# Patient Record
Sex: Male | Born: 2000 | Race: White | Hispanic: No | Marital: Single | Smoking: Never smoker
Health system: Southern US, Community
[De-identification: ages and names within clinical notes are randomized; demographics above are authoritative.]

## PROBLEM LIST (undated history)

## (undated) DIAGNOSIS — L509 Urticaria, unspecified: Secondary | ICD-10-CM

## (undated) DIAGNOSIS — F819 Developmental disorder of scholastic skills, unspecified: Secondary | ICD-10-CM

## (undated) DIAGNOSIS — J309 Allergic rhinitis, unspecified: Secondary | ICD-10-CM

## (undated) DIAGNOSIS — F909 Attention-deficit hyperactivity disorder, unspecified type: Secondary | ICD-10-CM

## (undated) HISTORY — DX: Urticaria, unspecified: L50.9

## (undated) HISTORY — DX: Allergic rhinitis, unspecified: J30.9

## (undated) HISTORY — PX: NO PAST SURGERIES: SHX2092

---
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
## 2023-01-23 ENCOUNTER — Encounter: Payer: Self-pay | Admitting: Pediatrics

## 2023-01-23 DIAGNOSIS — Z1379 Encounter for other screening for genetic and chromosomal anomalies: Secondary | ICD-10-CM | POA: Insufficient documentation

## 2023-02-16 ENCOUNTER — Encounter: Payer: Self-pay | Admitting: Pediatrics

## 2023-06-19 ENCOUNTER — Other Ambulatory Visit: Payer: Self-pay

## 2023-06-19 ENCOUNTER — Encounter: Payer: Self-pay | Admitting: Allergy & Immunology

## 2023-06-19 ENCOUNTER — Ambulatory Visit (INDEPENDENT_AMBULATORY_CARE_PROVIDER_SITE_OTHER): Payer: Commercial Managed Care - PPO | Admitting: Allergy & Immunology

## 2023-06-19 VITALS — BP 138/82 | HR 97 | Temp 98.6°F | Resp 16 | Ht 72.84 in | Wt 151.5 lb

## 2023-06-19 DIAGNOSIS — J310 Chronic rhinitis: Secondary | ICD-10-CM

## 2023-06-19 DIAGNOSIS — R1114 Bilious vomiting: Secondary | ICD-10-CM | POA: Diagnosis not present

## 2023-06-19 DIAGNOSIS — T7805XD Anaphylactic reaction due to tree nuts and seeds, subsequent encounter: Secondary | ICD-10-CM

## 2023-06-19 NOTE — Patient Instructions (Addendum)
 1. Chronic rhinitis  - Because of insurance stipulations, we cannot do skin testing on the same day as your first visit. - We are all working to fight this, but for now we need to do two separate visits.  - We will know more after we do testing at the next visit.  - The skin testing visit can be squeezed in at your convenience.  - Then we can make a more full plan to address all of his symptoms. - Be sure to stop your antihistamines for 3 days before this appointment.   2. Vomiting - We will see what the testing shows.   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 - We can update his EpiPen  then as well.  4. Return in about 1 week (around 06/26/2023). You can have the follow up appointment with Dr. Iva or a Nurse Practicioner (our Nurse Practitioners are excellent and always have Physician oversight!).    Please inform us  of any Emergency Department visits, hospitalizations, or changes in symptoms. Call us  before going to the ED for breathing or allergy  symptoms since we might be able to fit you in for a sick visit. Feel free to contact us  anytime with any questions, problems, or concerns.  It was a pleasure to meet you and your family today!  Websites that have reliable patient information: 1. American Academy of Asthma, Allergy , and Immunology: www.aaaai.org 2. Food Allergy  Research and Education (FARE): foodallergy.org 3. Mothers of Asthmatics: http://www.asthmacommunitynetwork.org 4. [employer] of Allergy , Asthma, and Immunology: www.acaai.org      "Like" us  on Facebook and Instagram for our latest updates!      A healthy democracy works best when [employer] participate! Make sure you are registered to vote! If you have moved or changed any of your contact information, you will need to get this updated before voting! Scan the QR codes below to learn more!          4

## 2023-06-19 NOTE — Progress Notes (Signed)
 NEW PATIENT  Date of Service/Encounter:  06/19/23  Consult requested by: Shona Norleen PEDLAR, MD   Assessment:   Tree nut allergy  (walnuts and hazelnuts)   Emesis - sounds like this is from postnasal drip  Environmental allergies - planning to test at the next visit  Developmentally delayed - seems to be plugged into a number of services, thankfully  Plan/Recommendations:   1. Chronic rhinitis  - Because of insurance stipulations, we cannot do skin testing on the same day as your first visit. - We are all working to fight this, but for now we need to do two separate visits.  - We will know more after we do testing at the next visit.  - The skin testing visit can be squeezed in at your convenience.  - Then we can make a more full plan to address all of his symptoms. - Be sure to stop your antihistamines for 3 days before this appointment.   2. Vomiting - We will see what the testing shows.   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 - We can update his EpiPen  then as well.  4. Return in about 1 week (around 06/26/2023). You can have the follow up appointment with Dr. Iva or a Nurse Practicioner (our Nurse Practitioners are excellent and always have Physician oversight!).   This note in its entirety was forwarded to the Provider who requested this consultation.  Subjective:   Jared Griffith is a 23 y.o. 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  Discussed the use of AI scribe software for clinical note transcription with the patient and/or guardian, who gave verbal consent to proceed.  Jared Griffith was referred by Shona Norleen PEDLAR, MD.     Jared Griffith is a 23 y.o. male presenting for an evaluation of allergies and food allergies .  Jared Griffith was last seen by Dr. Asa and Dr. Luke (over 3 years ago). He presents with recurrent episodes of vomiting, particularly in the shower. The frequency of these episodes is described as 'fairly frequent.' An endoscopy was performed with no significant findings.  The endoscopy was performed on October 29 by Dr. Selinda Molt in [place].  I cannot seem to see the actual results, but according to mom everything was normal.    Jared also has a history of sinus drainage, which was initially suspected to be the cause of the vomiting. However, despite the resolution of sinus drainage, the vomiting episodes have continued. The patient's mother reports that the vomitus is substantial in volume, although of course she is not in the shower with him. The patient's mother suspects that the vomiting episodes may be related to mold exposure from an old shower curtain, but this has not been  confirmed.  Jared Griffith has been on Protonix for approximately three to four months, which was initiated to manage suspected reflux. The medication has provided some relief, but the vomiting episodes persist. The patient also takes Benadryl  occasionally, which seems to provide some relief.  Jared Griffith has a history of a reaction to a large concentration of walnuts, but subsequent small exposures have not triggered a reaction.  It does not seem that he actually avoids any tree nuts to a significant level, but it is not a  significant part of his diet.  The patient also has a history of nasal congestion and has used nasal sprays intermittently. The patient's mother reports that the patient has difficulty blowing his nose.  The patient's mother is seeking further evaluation to rule out sinus problems as a potential cause of the vomiting episodes. The patient's mother also expressed interest in allergy  testing, particularly for environmental allergens and molds. She does NOT want to go to the [place] office because she is afraid she will know people in the waiting room.  She apparently works in patent examiner and does not want to see anyone she has arrested.    Allergic Rhinitis Symptom History: He is unable to blow his nose. He was seen by [facility] in [place], but they did not have a good experience with that practice.  They are worried about mold because of the possibility of mold and mildew.  He had some issues in his bathroom and they are trying to figure this out.  GERD Symptom History: He is on Protonix. HJe has been on that for 3-4 months. This has helped somewhat, but the sinus drainage is also better now. He did use some Benadryl  which does help.   History (copied and pasted from Dr. Marta note from September 2024: Following a healthy pregnancy, Jared Griffith was born at [redacted] weeks gestation (birth weight: 6 pounds, 10 ounces) via vaginal delivery. Jared Griffith's birth was complicated by prolonged time in the birth canal, and he had feeding difficulties due to weak oral motor coordination. Jared Griffith met the majority of major developmental milestones at expected times but crawled slightly later at 11 months and walked at 20 months. He also developed the use of short phrases later than expected at 30 months. Jared Griffith has no major medical concerns, but does have allergies to penicillin, sulfasalazine, and tree nuts. He is currently prescribed no medications. Jared Griffith has taken several medications in the past for ADHD, but these  became less effective and less necessary as he got older and demands at school began to shift. 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 As such, it is unlikely that this variant is pathogenic. His family history is notable for learning difficulties, ADHD, and schizophrenia.   Otherwise, there is no history of other atopic diseases, including drug allergies, stinging insect allergies, or contact dermatitis. There is no significant infectious history. Vaccinations are up to date.    Past Medical History: Patient Active Problem List   Diagnosis Date Noted   Genetic testing 01/23/2023   Seasonal and perennial allergic rhinoconjunctivitis 03/20/2019   Allergy  with anaphylaxis due to food 03/20/2019   Seasonal allergic conjunctivitis 09/25/2017   Seasonal allergic rhinitis due to pollen 09/25/2017   Low weight, pediatric, BMI less than 5th percentile for age 54/24/2016   ADHD (attention deficit hyperactivity disorder), combined type 01/26/2014   Dysarthria  01/26/2014   Developmental delay 07/27/2013   Perennial allergic rhinitis 05/27/2013    Medication List:  Allergies as of 06/19/2023       Reactions   Sulfa Antibiotics Hives   hives   Grass Pollen([id number] Vern) Other (See Comments)   Penicillins Hives        Medication List        Accurate as of June 19, 2023  4:39 PM. If you have any questions, ask your nurse or doctor.          STOP taking these medications    famotidine  20 MG tablet Commonly known as: PEPCID  Stopped by: Jared Griffith       TAKE these medications    benzonatate 100 MG capsule Commonly known as: TESSALON Take 100 mg by mouth 3 (three) times daily as needed for cough.   diphenhydrAMINE  25 MG tablet Commonly known as: BENADRYL  Take 1 tablet (25 mg total) by mouth  every 6 (six) hours as needed for up to 3 days.   EPINEPHrine  0.3 mg/0.3 mL Soaj injection Commonly known as: EPI-PEN INJECT INTO THE MUSCLE FOR ONE DOSE UTD   fluticasone  50 MCG/ACT nasal spray Commonly known as: Flonase  Use 2 sprays per nostril once a day for runny nose or itchy eyes   levocetirizine 5 MG tablet Commonly known as: XYZAL  Take 1 tablet (5 mg total) by mouth every evening.   loratadine 10 MG tablet Commonly known as: CLARITIN Take 10 mg by mouth at bedtime as needed for allergies.   minocycline 100 MG capsule Commonly known as: MINOCIN   ondansetron  4 MG disintegrating tablet Commonly known as: ZOFRAN -ODT Take 4 mg by mouth every 8 (eight) hours as needed for vomiting, nausea or refractory nausea / vomiting.   pantoprazole 40 MG tablet Commonly known as: PROTONIX Take 40 mg by mouth daily.        Birth History: non-contributory  Developmental History: non-contributory  Past Surgical History: Past Surgical History:  Procedure Laterality Date   NO PAST SURGERIES       Family History: Family History  Problem Relation Age of Onset   Asthma Mother    Allergic rhinitis Mother    Food Allergy  Mother    Eczema Neg Hx    Urticaria Neg Hx    Immunodeficiency Neg Hx    Angioedema Neg Hx      Social History: Jared Griffith lives at home with his parents.  They live in a house that is 65 years old.  There is hardwood in the main living areas and electric vinyl plank in the bedroom.  They have electric heating and central cooling.  There is a dog inside of the home.  There are no dust mite covers on the bedding.  There is no tobacco exposure.  He is currently in high school.  There is no fume, chemical, or dust exposure.  There is a HEPA filter in the home.  Do not live near an interstate or industrial area.   Review of systems otherwise negative other than that mentioned in the HPI.    Objective:   Blood pressure 138/82, pulse 97, temperature 98.6 F (37  C), temperature source Temporal, resp. rate 16, height 6' 0.84 (1.85 m), weight 151 lb 8 oz (68.7 kg), SpO2 96%. Body mass index is 20.08 kg/m.     Physical Exam Vitals reviewed.  Constitutional:      Appearance: He is well-developed and underweight. He is not ill-appearing or toxic-appearing.  Comments: Developmentally delayed. Talkative.   HENT:     Head: Normocephalic and atraumatic.     Right Ear: Tympanic membrane, ear canal and external ear normal. No drainage, swelling or tenderness. Tympanic membrane is not injected, scarred, erythematous, retracted or bulging.     Left Ear: Tympanic membrane, ear canal and external ear normal. No drainage, swelling or tenderness. Tympanic membrane is not injected, scarred, erythematous, retracted or bulging.     Nose: Mucosal edema and rhinorrhea present. No nasal deformity or septal deviation.     Right Turbinates: Enlarged and swollen.     Left Turbinates: Enlarged and swollen.     Right Sinus: No maxillary sinus tenderness or frontal sinus tenderness.     Left Sinus: No maxillary sinus tenderness or frontal sinus tenderness.     Mouth/Throat:     Mouth: Mucous membranes are not pale and not dry.     Pharynx: Uvula midline.  Eyes:     General:        Right eye: No discharge.        Left eye: No discharge.     Conjunctiva/sclera: Conjunctivae normal.     Right eye: Right conjunctiva is not injected. No chemosis.    Left eye: Left conjunctiva is not injected. No chemosis.    Pupils: Pupils are equal, round, and reactive to light.  Cardiovascular:     Rate and Rhythm: Normal rate and regular rhythm.     Heart sounds: Normal heart sounds.  Pulmonary:     Effort: Pulmonary effort is normal. No tachypnea, accessory muscle usage or respiratory distress.     Breath sounds: Normal breath sounds. No wheezing, rhonchi or rales.  Chest:     Chest wall: No tenderness.  Abdominal:     Tenderness: There is no abdominal tenderness. There is  no guarding or rebound.  Lymphadenopathy:     Head:     Right side of head: No submandibular, tonsillar or occipital adenopathy.     Left side of head: No submandibular, tonsillar or occipital adenopathy.     Cervical: No cervical adenopathy.  Skin:    Coloration: Skin is not pale.     Findings: No abrasion, erythema, petechiae or rash. Rash is not papular, urticarial or vesicular.  Neurological:     Mental Status: He is alert.  Psychiatric:        Behavior: Behavior is cooperative.      Diagnostic studies: deferred due to insurance stipulations that require a separate visit for testing         Jared Shaggy, MD [facility] 

## 2023-06-21 ENCOUNTER — Encounter: Payer: Self-pay | Admitting: Allergy & Immunology

## 2023-06-29 ENCOUNTER — Other Ambulatory Visit: Payer: Self-pay

## 2023-06-29 ENCOUNTER — Ambulatory Visit (INDEPENDENT_AMBULATORY_CARE_PROVIDER_SITE_OTHER): Payer: Commercial Managed Care - PPO | Admitting: Allergy & Immunology

## 2023-06-29 DIAGNOSIS — J3089 Other allergic rhinitis: Secondary | ICD-10-CM | POA: Diagnosis not present

## 2023-06-29 DIAGNOSIS — Z91018 Allergy to other foods: Secondary | ICD-10-CM

## 2023-06-29 DIAGNOSIS — J302 Other seasonal allergic rhinitis: Secondary | ICD-10-CM

## 2023-06-29 MED ORDER — AZELASTINE-FLUTICASONE 137-50 MCG/ACT NA SUSP: 2.0000 | Freq: Every day | NASAL | 5 refills | Status: DC | PRN

## 2023-06-29 MED ORDER — AZELASTINE-FLUTICASONE 137-50 MCG/ACT NA SUSP: 2.0000 | NASAL | 5 refills | Status: DC

## 2023-06-29 MED ORDER — EPINEPHRINE 0.3 MG/0.3ML IJ SOAJ: 0.3000 mg | Freq: Once | INTRAMUSCULAR | 2 refills | Status: AC

## 2023-06-29 MED ORDER — LEVOCETIRIZINE DIHYDROCHLORIDE 5 MG PO TABS: 5.0000 mg | ORAL_TABLET | Freq: Every evening | ORAL | 1 refills | Status: DC

## 2023-06-29 NOTE — Patient Instructions (Addendum)
1. Chronic rhinitis  - Testing today showed: grasses, ragweed, weeds, trees, cat, and cockroach - Copy of test results provided.  - Avoidance measures provided. - Start taking: Dymista two sprays per nostril twice daily (aim for the ears)  - Continue with: Xyzal (levocetirizine) 5mg  tablet once daily and Flonase (fluticasone) one spray per nostril daily (AIM FOR EAR ON EACH SIDE) - You can use an extra dose of the antihistamine, if needed, for breakthrough symptoms.  - Consider nasal saline rinses 1-2 times daily to remove allergens from the nasal cavities as well as help with mucous clearance (this is especially helpful to do before the nasal sprays are given) - We will start allergy shots as a means of long-term control. - Allergy shots "re-train" and "reset" the immune system to ignore environmental allergens and decrease the resulting immune response to those allergens (sneezing, itchy watery eyes, runny nose, nasal congestion, etc).    - Allergy shots improve symptoms in 75-85% of patients.   2. Vomiting - Hopefully controlling postnasal drip and mucus will help with decreasing vomiting.  3. Anaphylaxis due to tree nut - Testing was positive to hazelnut. - EpiPen renewed. - Emergency Action Plan updated.   4. Return in about 3 months (around 09/27/2023). You can have the follow up appointment with Dr. Dellis Anes or a Nurse Practicioner (our Nurse Practitioners are excellent and always have Physician oversight!).    Please inform us of any Emergency Department visits, hospitalizations, or changes in symptoms. Call us before going to the ED for breathing or allergy symptoms since we might be able to fit you in for a sick visit. Feel free to contact us anytime with any questions, problems, or concerns.  It was a pleasure to see you and your family again today!  Websites that have reliable patient information: 1. American Academy of Asthma, Allergy, and Immunology: www.aaaai.org 2. Food  Allergy Research and Education (FARE): foodallergy.org 3. Mothers of Asthmatics: http://www.asthmacommunitynetwork.org 4. American College of Allergy, Asthma, and Immunology: www.acaai.org      "Like" [place] on Facebook and Instagram for our latest updates!      A healthy democracy works best when [employer] participate! Make sure you are registered to vote! If you have moved or changed any of your contact information, you will need to get this updated before voting! Scan the QR codes below to learn more!         Airborne Adult Perc - 06/29/23 1419     Time Antigen Placed 1419    Allergen Manufacturer Waynette Buttery    Location Back    Number of Test 55    1. Control-Buffer 50% Glycerol Negative    2. Control-Histamine 2+    3. [place]ia 3+    4. [place] 2+    5. Johnson 3+    6. Kentucky Blue 3+    7. Meadow Fescue 3+    8. Perennial Rye 3+    9. Timothy 3+    10. Ragweed Mix Negative    11. Cocklebur 2+    12. Plantain,  English Negative    13. Baccharis Negative    14. Dog Fennel Negative    15. Russian Thistle Negative    16. Lamb's Quarters Negative    17. Sheep Sorrell Negative    18. Rough Pigweed Negative    19. Marsh Elder, Rough 2+    20. Mugwort, Common 2+    21. Box, Elder Negative    22. Cedar, red 2+  23. Sweet Gum 3+    24. Pecan Pollen 3+    25. Pine Mix Negative    26. Walnut, Black Pollen 2+    27. Red Mulberry 2+    28. Ash Mix 2+    29. Birch Mix 3+    30. Beech American 3+    31. Cottonwood, [place] 2+    32. Hickory, White 3+    33. Maple Mix 2+    34. Oak, [place] Mix 2+    35. Sycamore Eastern 3+    36. Alternaria Alternata Negative    37. Cladosporium Herbarum 2+    38. Aspergillus Mix Negative    39. Penicillium Mix Negative    40. Bipolaris Sorokiniana (Helminthosporium) Negative    41. Drechslera Spicifera (Curvularia) Negative    42. Mucor Plumbeus Negative    43. Fusarium Moniliforme Negative    44. Aureobasidium Pullulans (pullulara)  Negative    45. Rhizopus Oryzae Negative    46. Botrytis Cinera Negative    47. Epicoccum Nigrum Negative    48. Phoma Betae Negative    49. Dust Mite Mix Negative    50. Cat Hair 10,000 BAU/ml 2+    51.  Dog Epithelia Negative    52. Mixed Feathers Negative    53. Horse Epithelia Negative    54. Cockroach, German 3+    55. Tobacco Leaf Negative             Food Adult Perc - 06/29/23 1400     Time Antigen Placed 1419    Allergen Manufacturer Waynette Buttery    Location Back    Number of allergen test 8    10. Cashew Negative    11. Walnut Food Negative    12. Almond Negative    13. Hazelnut --   5 x 10   14. Pecan Food Negative    15. Pistachio Negative    16. [place] Nut Negative    17. Coconut Negative            Reducing Pollen Exposure  The American Academy of Allergy, Asthma and Immunology suggests the following steps to reduce your exposure to pollen during allergy seasons.    Do not hang sheets or clothing out to dry; pollen may collect on these items. Do not mow lawns or spend time around freshly cut grass; mowing stirs up pollen. Keep windows closed at night.  Keep car windows closed while driving. Minimize morning activities outdoors, a time when pollen counts are usually at their highest. Stay indoors as much as possible when pollen counts or humidity is high and on windy days when pollen tends to remain in the air longer. Use air conditioning when possible.  Many air conditioners have filters that trap the pollen spores. Use a HEPA room air filter to remove pollen form the indoor air you breathe.  Control of Dog or Cat Allergen  Avoidance is the best way to manage a dog or cat allergy. If you have a dog or cat and are allergic to dog or cats, consider removing the dog or cat from the home. If you have a dog or cat but don't want to find it a new home, or if your family wants a pet even though someone in the household is allergic, here are some strategies that may  help keep symptoms at bay:  Keep the pet out of your bedroom and restrict it to only a few rooms. Be advised that keeping the dog or cat  in only one room will not limit the allergens to that room. Don't pet, hug or kiss the dog or cat; if you do, wash your hands with soap and water. High-efficiency particulate air (HEPA) cleaners run continuously in a bedroom or living room can reduce allergen levels over time. Regular use of a high-efficiency vacuum cleaner or a central vacuum can reduce allergen levels. Giving your dog or cat a bath at least once a week can reduce airborne allergen.  Control of Cockroach Allergen  Cockroach allergen has been identified as an important cause of acute attacks of asthma, especially in urban settings.  There are fifty-five species of cockroach that exist in the [place], however only three, the [place], [place] species produce allergen that can affect patients with Asthma.  Allergens can be obtained from fecal particles, egg casings and secretions from cockroaches.    Remove food sources. Reduce access to water. Seal access and entry points. Spray runways with 0.5-1% Diazinon or Chlorpyrifos Blow boric acid power under stoves and refrigerator. Place bait stations (hydramethylnon) at feeding sites.  Control of Mold Allergen   Mold and fungi can grow on a variety of surfaces provided certain temperature and moisture conditions exist.  Outdoor molds grow on plants, decaying vegetation and soil.  The major outdoor mold, Alternaria and Cladosporium, are found in very high numbers during hot and dry conditions.  Generally, a late Summer - Fall peak is seen for common outdoor fungal spores.  Rain will temporarily lower outdoor mold spore count, but counts rise rapidly when the rainy period ends.  The most important indoor molds are Aspergillus and Penicillium.  Dark, humid and poorly ventilated basements are ideal sites for mold growth.  The next most  common sites of mold growth are the bathroom and the kitchen.  Outdoor (Seasonal) Mold Control  Positive outdoor molds via skin testing: Alternaria  Use air conditioning and keep windows closed Avoid exposure to decaying vegetation. Avoid leaf raking. Avoid grain handling. Consider wearing a face mask if working in moldy areas.

## 2023-06-29 NOTE — Progress Notes (Unsigned)
FOLLOW UP  Date of Service/Encounter:  06/29/23   Assessment:   Tree nut allergy (walnuts and hazelnuts)    Emesis - sounds like this is from postnasal drip   Perennial and seasonal allergic rhinitis (grasses, ragweed, weeds, trees, cat, and cockroach) - we are going to work on controlling the postnasal drip with medications before advancing to allergy shots   Developmentally delayed - established into a number of services  Plan/Recommendations:   1. Chronic rhinitis  - Testing today showed: grasses, ragweed, weeds, trees, cat, and cockroach - Copy of test results provided.  - Avoidance measures provided. - Start taking: Dymista two sprays per nostril twice daily (aim for the ears)  - Continue with: Xyzal (levocetirizine) 5mg  tablet once daily and Flonase (fluticasone) one spray per nostril daily (AIM FOR EAR ON EACH SIDE) - You can use an extra dose of the antihistamine, if needed, for breakthrough symptoms.  - Consider nasal saline rinses 1-2 times daily to remove allergens from the nasal cavities as well as help with mucous clearance (this is especially helpful to do before the nasal sprays are given) - We will consider start allergy shots as a means of long-term control. - Allergy shots "re-train" and "reset" the immune system to ignore environmental allergens and decrease the resulting immune response to those allergens (sneezing, itchy watery eyes, runny nose, nasal congestion, etc).    - Allergy shots improve symptoms in 75-85% of patients.   2. Vomiting - Hopefully controlling postnasal drip and mucus will help with decreasing vomiting.  3. Anaphylaxis due to tree nut - Testing was positive to hazelnut. - EpiPen renewed. - Emergency Action Plan updated.   4. Return in about 3 months (around 09/27/2023). You can have the follow up appointment with Dr. Dellis Anes or a Nurse Practicioner (our Nurse Practitioners are excellent and always have Physician oversight!).     Subjective:   Jared Griffith is a 23 y.o. male presenting today for follow up of No chief complaint on file.   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  Discussed the use of AI scribe software for clinical note transcription with the patient and/or guardian, who gave verbal consent to proceed.  Jared Griffith is a 23 y.o. male presenting for skin testing. He was last seen on January 14th. We could not do testing because his insurance company does not cover testing on the same day as a New Patient visit. He has been off of all antihistamines 3 days in anticipation of the testing.   Otherwise, there have been no changes to his past medical history, surgical history, family history, or social history.    Review of systems otherwise negative other than that mentioned in the HPI.    Objective:   There were no vitals taken for this visit. There is no height or weight on file to calculate BMI.    Physical exam deferred since this was a  skin testing appointment only.   Diagnostic studies:    Allergy Studies:     Airborne Adult Perc - 06/29/23 1419     Time Antigen Placed 1419    Allergen Manufacturer Waynette Buttery    Location Back    Number of Test 55    1. Control-Buffer 50% Glycerol Negative    2. Control-Histamine 2+    3. Bahia 3+    4. [place] 2+    5. Johnson 3+    6.  [place] Blue 3+    7. Meadow Fescue 3+    8. Perennial Rye 3+    9. Timothy 3+    10. Ragweed Mix Negative    11. Cocklebur 2+    12. Plantain,  English Negative    13. Baccharis Negative    14. Dog Fennel Negative    15. Russian Thistle Negative    16. Lamb's Quarters Negative    17. Sheep Sorrell Negative    18. Rough Pigweed Negative    19. Marsh Elder, Rough 2+    20. Mugwort, Common 2+    21. Box, Elder Negative    22. Cedar, red 2+    23. Sweet Gum 3+    24. Pecan Pollen 3+    25. Pine Mix Negative    26. Walnut, Black Pollen 2+    27. Red Mulberry 2+    28. Ash Mix 2+    29. Birch Mix 3+    30. Beech American 3+    31. Cottonwood, [place] 2+    32. Hickory, White 3+    33. Maple Mix 2+    34. Oak, [place] Mix 2+    35. Sycamore Eastern 3+    36. Alternaria Alternata Negative    37. Cladosporium Herbarum 2+    38. Aspergillus Mix Negative    39. Penicillium Mix Negative    40. Bipolaris Sorokiniana (Helminthosporium) Negative    41. Drechslera Spicifera (Curvularia) Negative    42. Mucor Plumbeus Negative    43. Fusarium Moniliforme Negative    44. Aureobasidium Pullulans (pullulara) Negative    45. Rhizopus Oryzae Negative    46. Botrytis Cinera Negative    47. Epicoccum Nigrum Negative    48. Phoma Betae Negative    49. Dust Mite Mix Negative    50. Cat Hair 10,000 BAU/ml 2+    51.  Dog Epithelia Negative    52. Mixed Feathers Negative    53. Horse Epithelia Negative    54. Cockroach, German 3+    55. Tobacco Leaf Negative             Food Adult Perc - 06/29/23 1400     Time Antigen Placed 1419    Allergen Manufacturer Waynette Buttery    Location Back    Number of allergen test 8    10. Cashew Negative    11. Walnut Food Negative    12. Almond Negative    13. Hazelnut --   5 x 10   14. Pecan Food Negative    15. Pistachio Negative    16. [place] Nut Negative    17. Coconut Negative             Allergy testing results were read and interpreted  by myself, documented by clinical staff.      Malachi Bonds, MD  [facility]

## 2023-07-01 ENCOUNTER — Encounter: Payer: Self-pay | Admitting: Allergy & Immunology

## 2023-07-02 ENCOUNTER — Telehealth: Payer: Self-pay

## 2023-07-02 ENCOUNTER — Other Ambulatory Visit (HOSPITAL_COMMUNITY): Payer: Self-pay

## 2023-07-02 NOTE — Telephone Encounter (Signed)
Called patient - spoke to mother, Herbert Seta  - DOB/DPR verified - advised of PA notation below.  Mom asked about PA on Fluticasone (Flonase) nasal spray -  advised no updates yet.  Mom verbalized understanding, no further questions.

## 2023-07-02 NOTE — Telephone Encounter (Signed)
Pharmacy Patient Advocate Encounter   Received notification from Patient Pharmacy that prior authorization for EPINEPHrine 0.3mg /0.32mL Soaj injection is required/requested.   Insurance verification completed.   The patient is insured through [employer] .   Per test claim: The current 30 day co-pay is, $10.00.  No PA needed at this time. This test claim was processed through [facility]- copay amounts may vary at other pharmacies due to pharmacy/plan contracts, or as the patient moves through the different stages of their insurance plan.

## 2023-07-03 ENCOUNTER — Other Ambulatory Visit (HOSPITAL_COMMUNITY): Payer: Self-pay

## 2023-07-03 ENCOUNTER — Telehealth: Payer: Self-pay

## 2023-07-03 NOTE — Telephone Encounter (Signed)
*  Asthma/Allergy  Pharmacy Patient Advocate Encounter  Received notification from [facility] that Prior Authorization for Azelastine-Fluticasone 137-50MCG/ACT suspension  has been APPROVED from 07/03/2023 to 07/01/2024. Ran test claim, Copay is $10.00. This test claim was processed through [facility]- copay amounts may vary at other pharmacies due to pharmacy/plan contracts, or as the patient moves through the different stages of their insurance plan.   PA #/Case ID/Reference #: [id number]

## 2023-07-24 ENCOUNTER — Other Ambulatory Visit: Payer: Self-pay | Admitting: Allergy & Immunology

## 2023-07-24 DIAGNOSIS — J3081 Allergic rhinitis due to animal (cat) (dog) hair and dander: Secondary | ICD-10-CM | POA: Diagnosis not present

## 2023-07-24 DIAGNOSIS — J3089 Other allergic rhinitis: Secondary | ICD-10-CM

## 2023-07-24 NOTE — Progress Notes (Signed)
 VIAL SET ONE MADE 07-24-23.  EXP 08-02-24.

## 2023-07-24 NOTE — Progress Notes (Signed)
 Aeroallergen Immunotherapy   Ordering Provider: Dr. Malachi Bonds   Patient Details  Name: Jared Griffith  MRN: [id number]  Date of Birth: Aug 22, 2000   Order 2 of 2   Vial Label: RW/DM/Molds/CR   0.3 ml (Volume)  1:20 Concentration -- Ragweed Mix  0.2 ml (Volume)  1:20 Concentration -- Alternaria alternata  0.2 ml (Volume)  1:20 Concentration -- Cladosporium herbarum  0.2 ml (Volume)  1:10 Concentration -- Mucor plumbeus  0.2 ml (Volume)  1:10 Concentration -- Fusarium moniliforme  0.3 ml (Volume)  1:20 Concentration -- Cockroach, German  0.7 ml (Volume)   AU Concentration -- Mite Mix (DF 5,000 & DP 5,000)    2.1  ml Extract Subtotal  2.9  ml Diluent  5.0  ml Maintenance Total   Schedule:  B   Blue Vial (1:100,000): Schedule B (6 doses)  Yellow Vial (1:10,000): Schedule B (6 doses)  Green Vial (1:1,000): Schedule B (6 doses)  Red Vial (1:100): Schedule A (14 doses)   Special Instructions: After completion of the first Red Vial, please space to every two weeks. After completion of the second Red Vial, please space to every 4 weeks. Ok to up dose new vials at 0.76mL --> 0.3 mL --> 0.5 mL. Ok to come twice weekly, if desired, as long as there is 48 hours between injections.

## 2023-07-24 NOTE — Progress Notes (Signed)
 Aeroallergen Immunotherapy   Ordering Provider: Dr. Malachi Bonds   Patient Details  Name: Jared Griffith  MRN: [id number]  Date of Birth: [birth year]   Order 1 of 2   Vial Label: G/W/T/C/D   0.3 ml (Volume)  BAU Concentration -- 7 Grass Mix* 100,000 ([address] [place], [place], [place], [place] Rye, RedTop, Sweet Vernal, Timothy)  0.2 ml (Volume)  1:20 Concentration -- Bahia  0.3 ml (Volume)  BAU Concentration -- [place] 10,000  0.2 ml (Volume)  1:20 Concentration -- Johnson  0.2 ml (Volume)  1:20 Concentration -- Cocklebur  0.2 ml (Volume)  1:20 Concentration -- Marsh elder, Rough*  0.2 ml (Volume)  1:20 Concentration -- Mugwort, Common*  0.5 ml (Volume)  1:20 Concentration -- Eastern 10 Tree Mix (also Sweet Gum)  0.2 ml (Volume)  1:10 Concentration -- Cedar, red  0.2 ml (Volume)  1:10 Concentration -- Pecan Pollen  0.2 ml (Volume)  1:10 Concentration -- Pine Mix  0.2 ml (Volume)  1:20 Concentration -- Red Mulberry  0.2 ml (Volume)  1:20 Concentration -- Sweet Gum  0.2 ml (Volume)  1:20 Concentration -- Walnut, Black Pollen  0.5 ml (Volume)  1:10 Concentration -- Cat Hair  0.5 ml (Volume)  1:10 Concentration -- Dog Epithelia    4.3  ml Extract Subtotal  0.7  ml Diluent  5.0  ml Maintenance Total   Schedule:  B   Blue Vial (1:100,000): Schedule B (6 doses)  Yellow Vial (1:10,000): Schedule B (6 doses)  Green Vial (1:1,000): Schedule B (6 doses)  Red Vial (1:100): Schedule A (14 doses)   Special Instructions: After completion of the first Red Vial, please space to every two weeks. After completion of the second Red Vial, please space to every 4 weeks. Ok to up dose new vials at 0.21mL --> 0.3 mL --> 0.5 mL. Ok to come twice weekly, if desired, as long as there is 48 hours between injections.

## 2023-07-24 NOTE — Progress Notes (Unsigned)
 Orders written for immunotherapy. We are combining results from Dr. Beaulah Dinning and his newest testing.

## 2023-07-25 DIAGNOSIS — J3089 Other allergic rhinitis: Secondary | ICD-10-CM | POA: Diagnosis not present

## 2023-07-25 NOTE — Progress Notes (Signed)
 VIAL SET TWO MADE 07-25-23.  EXP 07-24-24.

## 2023-07-27 ENCOUNTER — Ambulatory Visit: Payer: Commercial Managed Care - PPO

## 2023-08-06 ENCOUNTER — Ambulatory Visit (INDEPENDENT_AMBULATORY_CARE_PROVIDER_SITE_OTHER): Payer: Commercial Managed Care - PPO

## 2023-08-06 DIAGNOSIS — J309 Allergic rhinitis, unspecified: Secondary | ICD-10-CM

## 2023-08-06 NOTE — Progress Notes (Signed)
 Immunotherapy   Patient Details  Name: Jared Griffith MRN: [id number] Date of Birth: September 17, 2000  08/06/2023  Jared Griffith started injections for  rag weeds, dust mites, colds, cock roaches, grasses, weeds, trees, dogs, and cats. Following schedule: B  Frequency:2 times per week Epi-Pen:Epi-Pen Available  Consent signed in office previously and patient instructions given. Patient was reminded to bring Epipen to every injection appointment. Patient and his mom sat in the lobby for thirty minutes without an issue.    Ralene Muskrat 08/06/2023, 2:08 PM

## 2023-08-10 ENCOUNTER — Ambulatory Visit (INDEPENDENT_AMBULATORY_CARE_PROVIDER_SITE_OTHER): Payer: Self-pay

## 2023-08-10 DIAGNOSIS — J309 Allergic rhinitis, unspecified: Secondary | ICD-10-CM | POA: Diagnosis not present

## 2023-08-13 ENCOUNTER — Ambulatory Visit (INDEPENDENT_AMBULATORY_CARE_PROVIDER_SITE_OTHER)

## 2023-08-13 DIAGNOSIS — J309 Allergic rhinitis, unspecified: Secondary | ICD-10-CM

## 2023-08-29 ENCOUNTER — Ambulatory Visit (INDEPENDENT_AMBULATORY_CARE_PROVIDER_SITE_OTHER)

## 2023-08-29 DIAGNOSIS — J309 Allergic rhinitis, unspecified: Secondary | ICD-10-CM | POA: Diagnosis not present

## 2023-08-31 ENCOUNTER — Ambulatory Visit (INDEPENDENT_AMBULATORY_CARE_PROVIDER_SITE_OTHER): Payer: Self-pay

## 2023-08-31 DIAGNOSIS — J309 Allergic rhinitis, unspecified: Secondary | ICD-10-CM

## 2023-09-05 ENCOUNTER — Ambulatory Visit (INDEPENDENT_AMBULATORY_CARE_PROVIDER_SITE_OTHER)

## 2023-09-05 DIAGNOSIS — J309 Allergic rhinitis, unspecified: Secondary | ICD-10-CM

## 2023-09-12 ENCOUNTER — Ambulatory Visit (INDEPENDENT_AMBULATORY_CARE_PROVIDER_SITE_OTHER)

## 2023-09-12 DIAGNOSIS — J309 Allergic rhinitis, unspecified: Secondary | ICD-10-CM | POA: Diagnosis not present

## 2023-09-19 ENCOUNTER — Ambulatory Visit (INDEPENDENT_AMBULATORY_CARE_PROVIDER_SITE_OTHER): Payer: Self-pay

## 2023-09-19 DIAGNOSIS — J309 Allergic rhinitis, unspecified: Secondary | ICD-10-CM | POA: Diagnosis not present

## 2023-09-26 ENCOUNTER — Ambulatory Visit (INDEPENDENT_AMBULATORY_CARE_PROVIDER_SITE_OTHER): Payer: Self-pay

## 2023-09-26 DIAGNOSIS — J309 Allergic rhinitis, unspecified: Secondary | ICD-10-CM

## 2023-09-27 ENCOUNTER — Other Ambulatory Visit: Payer: Self-pay

## 2023-09-27 ENCOUNTER — Ambulatory Visit: Payer: Commercial Managed Care - PPO | Admitting: Allergy & Immunology

## 2023-09-27 ENCOUNTER — Encounter: Payer: Self-pay | Admitting: Allergy & Immunology

## 2023-09-27 VITALS — BP 126/98 | HR 89 | Temp 98.7°F | Resp 12

## 2023-09-27 DIAGNOSIS — Z91018 Allergy to other foods: Secondary | ICD-10-CM

## 2023-09-27 DIAGNOSIS — J302 Other seasonal allergic rhinitis: Secondary | ICD-10-CM | POA: Diagnosis not present

## 2023-09-27 DIAGNOSIS — J3089 Other allergic rhinitis: Secondary | ICD-10-CM

## 2023-09-27 MED ORDER — CYPROHEPTADINE HCL 4 MG PO TABS: 8.0000 mg | ORAL_TABLET | Freq: Every day | ORAL | 5 refills | Status: DC

## 2023-09-27 NOTE — Progress Notes (Signed)
 FOLLOW UP  Date of Service/Encounter:  09/27/23   Assessment:   Tree nut allergy  (walnuts and hazelnuts)    Emesis - sounds like this is from postnasal drip   Perennial and seasonal allergic rhinitis (grasses, ragweed, weeds, trees, cat, and cockroach) - we are going to work on controlling the postnasal drip with medications before advancing to allergy  shots   Developmentally delayed - established into a number of services  Plan/Recommendations:   1. Chronic rhinitis  - Testing at the last showed: grasses, ragweed, weeds, trees, cat, and cockroach - Continue taking: Dymista  two sprays per nostril twice daily (aim for the ears)  - Continue taking: Xyzal  (levocetirizine) 5mg  tablet once daily - Start taking: Periactin  (cyproheptadine ) 8mg   - You can use an extra dose of the antihistamine, if needed, for breakthrough symptoms.  - Consider nasal saline rinses 1-2 times daily to remove allergens from the nasal cavities as well as help with mucous clearance (this is especially helpful to do before the nasal sprays are given) - Continue with allergy  shots at the same schedule.   2. Vomiting - Hopefully controlling postnasal drip and mucus will help with decreasing vomiting.  3. 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  - I think it is fine to eat the other tree nuts.   4. Return in about 1 year (around 09/26/2024). You can have the follow up appointment with Dr. Idolina Maker or a Nurse Practicioner (our Nurse Practitioners are excellent and always have Physician oversight!).    Subjective:   Jared Griffith is a 23 y.o. 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  Discussed the use of AI scribe software for clinical note transcription with the patient and/or guardian, who gave verbal consent to proceed.  Jared Griffith is a 23 y.o. male presenting for a follow up visit.  He was last seen in January 2025.  At that time, testing was positive to multiple indoor and outdoor allergens.  We started Dymista  2 sprays per nostril twice daily and continue with Xyzal  5 mg daily.  He did decide to start allergen immunotherapy.  For his vomiting, we are hopeful that controlling postnasal drip and mucus will help with decreasing vomiting.  He had testing was positive to hazelnut.  Since last visit, he has done well.   Allergic Rhinitis Symptom History: He is currently receiving allergy  shots once a week without any issues. He uses a nasal spray, which causes a burning sensation but is manageable. He is currently taking Xyzal  (levocetirizine) in the mornings.  Jared Griffith is on allergen immunotherapy. He receives two injections. Immunotherapy script #1 contains trees, weeds, grasses, cat, and dog. He currently receives 0.81mL of the GOLD vial (1/10,000). Immunotherapy script #2 contains  ragweed, molds, dust mites,  and cockroach. He currently receives 0.3mL of the GOLD vial (1/10,000). He started shots March of 2025 and not yet reached maintenance.   Food Allergy  Symptom History: He has a history of nut allergies, with hazelnuts being the most problematic. He recently consumed a cookie containing walnuts without any adverse reaction.  GERD Symptom History: He  has been on reflux medications, which have not alleviated his symptoms.  He does still have vomiting episodes, mostly in the shower. He experiences nausea and vomiting primarily when showering, associated with post-nasal drainage. He has undergone an endoscopy and is scheduled to see an ENT specialist on July 2nd for further evaluation. He had a cold last week, took antibiotics, and did not experience vomiting during that time. He continues to have a cough in the evenings and mornings.  Otherwise, there have been no changes to his past medical history, surgical history, family history, or social history.    Review of systems otherwise negative other than that mentioned in the HPI.    Objective:   Blood pressure (!) 126/98, pulse 89, temperature 98.7 F (37.1 C), resp. rate 12, SpO2 97%. There is no height or weight on file to calculate BMI.    Physical Exam Vitals reviewed.  Constitutional:      Appearance: He is well-developed and underweight. He is not ill-appearing or toxic-appearing.     Comments: Developmentally delayed. Talkative.   HENT:     Head: Normocephalic and atraumatic.     Right Ear: Tympanic membrane, ear canal and external ear normal. No drainage, swelling or tenderness. Tympanic membrane is not injected, scarred, erythematous, retracted or bulging.     Left Ear: Tympanic membrane, ear canal and external ear normal. No drainage, swelling or tenderness. Tympanic membrane is not injected, scarred, erythematous, retracted or bulging.     Nose: Mucosal edema and rhinorrhea present. No nasal deformity or septal deviation.     Right Turbinates: Enlarged, swollen and pale.     Left Turbinates: Enlarged, swollen and pale.     Right Sinus: No maxillary sinus tenderness or frontal sinus tenderness.     Left Sinus: No maxillary sinus tenderness or frontal sinus tenderness.     Mouth/Throat:     Mouth: Mucous membranes are not pale and not dry.     Pharynx: Uvula midline.   Eyes:     General: Lids are normal. Allergic shiner present.        Right eye: No discharge.        Left eye: No discharge.     Conjunctiva/sclera: Conjunctivae normal.     Right eye: Right conjunctiva is not injected. No chemosis.    Left eye: Left conjunctiva is not injected. No chemosis.    Pupils: Pupils are equal, round, and reactive to light.  Cardiovascular:     Rate and Rhythm: Normal rate and regular rhythm.     Heart sounds: Normal heart sounds.  Pulmonary:     Effort: Pulmonary effort is normal. No tachypnea, accessory muscle usage or respiratory distress.     Breath sounds: Normal breath sounds. No wheezing, rhonchi or rales.  Chest:     Chest wall: No tenderness.  Abdominal:     Tenderness: There is no abdominal tenderness. There is no guarding or rebound.  Lymphadenopathy:     Head:     Right side of head: No submandibular, tonsillar or occipital adenopathy.     Left side of head: No submandibular, tonsillar or occipital adenopathy.  Cervical: No cervical adenopathy.  Skin:    General: Skin is warm.     Capillary Refill: Capillary refill takes less than 2 seconds.     Coloration: Skin is not pale.     Findings: No abrasion, erythema, petechiae or rash. Rash is not papular, urticarial or vesicular.  Neurological:     Mental Status: He is alert.  Psychiatric:        Behavior: Behavior is cooperative.      Diagnostic studies: none      Drexel Gentles, MD  [facility] 

## 2023-09-27 NOTE — Patient Instructions (Addendum)
 1. Chronic rhinitis  - Testing at the last showed: grasses, ragweed, weeds, trees, cat, and cockroach - Continue taking: Dymista  two sprays per nostril twice daily (aim for the ears)  - Continue taking: Xyzal  (levocetirizine) 5mg  tablet once daily - Start taking: Periactin  (cyproheptadine ) 8mg   - You can use an extra dose of the antihistamine, if needed, for breakthrough symptoms.  - Consider nasal saline rinses 1-2 times daily to remove allergens from the nasal cavities as well as help with mucous clearance (this is especially helpful to do before the nasal sprays are given) - Continue with allergy  shots at the same schedule.   2. Vomiting - Hopefully controlling postnasal drip and mucus will help with decreasing vomiting.  3. 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  - I think it is fine to eat the other tree nuts.   4. Return in about 1 year (around 09/26/2024). You can have the follow up appointment with Dr. Idolina Maker or a Nurse Practicioner (our Nurse Practitioners are excellent and always have Physician oversight!).    Please inform us  of any Emergency Department visits, hospitalizations, or changes in symptoms. Call us  before going to the ED for breathing or allergy  symptoms since we might be able to fit you in for a sick visit. Feel free to contact us  anytime with any questions, problems, or concerns.  It was a pleasure to see you and your family again today!  Websites that have reliable patient information: 1. American Academy of Asthma, Allergy , and Immunology: www.aaaai.org 2. Food Allergy  Research and Education (FARE): foodallergy.org 3. Mothers of Asthmatics: http://www.asthmacommunitynetwork.org 4. [employer] of Allergy , Asthma, and Immunology: www.acaai.org      "Like" us  on Facebook and Instagram for our latest updates!      A healthy democracy works best when [employer] participate! Make sure you are registered to vote! If you  have moved or changed any of your contact information, you will need to get this updated before voting! Scan the QR codes below to learn more!

## 2023-10-03 ENCOUNTER — Ambulatory Visit (INDEPENDENT_AMBULATORY_CARE_PROVIDER_SITE_OTHER)

## 2023-10-03 DIAGNOSIS — J309 Allergic rhinitis, unspecified: Secondary | ICD-10-CM

## 2023-10-05 ENCOUNTER — Ambulatory Visit (INDEPENDENT_AMBULATORY_CARE_PROVIDER_SITE_OTHER)

## 2023-10-05 DIAGNOSIS — J309 Allergic rhinitis, unspecified: Secondary | ICD-10-CM

## 2023-10-10 ENCOUNTER — Ambulatory Visit (INDEPENDENT_AMBULATORY_CARE_PROVIDER_SITE_OTHER)

## 2023-10-10 DIAGNOSIS — J309 Allergic rhinitis, unspecified: Secondary | ICD-10-CM | POA: Diagnosis not present

## 2023-10-17 ENCOUNTER — Ambulatory Visit (INDEPENDENT_AMBULATORY_CARE_PROVIDER_SITE_OTHER)

## 2023-10-17 DIAGNOSIS — J309 Allergic rhinitis, unspecified: Secondary | ICD-10-CM | POA: Diagnosis not present

## 2023-10-24 ENCOUNTER — Ambulatory Visit (INDEPENDENT_AMBULATORY_CARE_PROVIDER_SITE_OTHER)

## 2023-10-24 DIAGNOSIS — J309 Allergic rhinitis, unspecified: Secondary | ICD-10-CM

## 2023-10-31 ENCOUNTER — Encounter: Payer: Self-pay | Admitting: Allergy & Immunology

## 2023-10-31 NOTE — Telephone Encounter (Signed)
 Mom called and expressed that she is giving patient medications at night and doubling up. Please advise on another treatment plan to help as the injections seem to make him symptoms worse.

## 2023-11-02 ENCOUNTER — Other Ambulatory Visit: Payer: Self-pay

## 2023-11-02 ENCOUNTER — Ambulatory Visit: Admitting: Family Medicine

## 2023-11-02 ENCOUNTER — Encounter: Payer: Self-pay | Admitting: Family Medicine

## 2023-11-02 VITALS — BP 130/80 | HR 100 | Temp 98.4°F | Resp 18 | Ht 72.44 in | Wt 161.0 lb

## 2023-11-02 DIAGNOSIS — J302 Other seasonal allergic rhinitis: Secondary | ICD-10-CM

## 2023-11-02 DIAGNOSIS — Z91018 Allergy to other foods: Secondary | ICD-10-CM | POA: Diagnosis not present

## 2023-11-02 DIAGNOSIS — J3089 Other allergic rhinitis: Secondary | ICD-10-CM | POA: Diagnosis not present

## 2023-11-02 NOTE — Progress Notes (Signed)
 [address] [place] [address] Dept: [phone]  FOLLOW UP NOTE  Patient ID: Jared Griffith, male    DOB: 2001-03-02  Age: 23 y.o. MRN: [id number] Date of Office Visit: 11/02/2023  Assessment  Chief Complaint: Establish Care (Pt has gotton sick after injections)  HPI Jared Griffith is a 23 year old male who presents to clinic for an acute visit.  He was last seen in this clinic on 09/27/2023 by Dr. Idolina Maker for evaluation of allergic rhinitis and food allergy .  He is accompanied by his father who assists with history.    At today's visit, he reports that after the last 2 injections he began to experience "cold symptoms" including clear rhinorrhea and nasal congestion.  He denies fever, sweats, chills, or sick contacts.  He denies anaphylactic symptoms including throat closing, hives, or GI issues.  He reports that he continues Xyzol and cetirizine daily which he usually alleviates symptoms of allergic rhinitis.  He continues Dymista  infrequently and is not currently using a nasal saline rinse.  Dad reports that he cannot blow his nose well and cannot produce a forceful cough.  He began allergen immunotherapy directed toward grass pollen, weed pollen, tree pollen, cat, dog, ragweed, dust mite, cockroach, and mold on 08/06/2023.  He denies large or local reactions.  He reports a moderate decrease in his symptoms of allergic rhinitis while continuing on allergen immunotherapy.  We discussed increasing the nasal spray frequency as well as moving back a couple doses on allergen immunotherapy (green 0.3 mg today).  Jared Griffith and his father agree with this plan.  He continues to avoid hazelnut with no accidental ingestion or EpiPen  use since his last visit to this clinic.  EpiPen  set is up-to-date.  His current medications are listed in the chart.  Drug Allergies:  Allergies  Allergen Reactions   Sulfa Antibiotics Hives    hives   Grass Pollen(K-O-R-T-Swt Vern) Other (See  Comments)   Penicillins Hives    Physical Exam: BP 130/80 (BP Location: Right Arm, Patient Position: Sitting, Cuff Size: Normal)   Pulse 100   Temp 98.4 F (36.9 C) (Temporal)   Resp 18   Ht 6' 0.44" (1.84 m)   Wt 161 lb (73 kg)   SpO2 97%   BMI 21.57 kg/m    Physical Exam Vitals reviewed.  Constitutional:      Appearance: Normal appearance.  HENT:     Head: Normocephalic and atraumatic.     Right Ear: Tympanic membrane normal.     Left Ear: Tympanic membrane normal.     Nose:     Comments: Bilateral nares slightly erythematous with thin clear nasal drainage noted.  Pharynx normal.  Ears normal.  Eyes normal.    Mouth/Throat:     Pharynx: Oropharynx is clear.  Eyes:     Conjunctiva/sclera: Conjunctivae normal.  Cardiovascular:     Rate and Rhythm: Normal rate and regular rhythm.     Heart sounds: Normal heart sounds. No murmur heard. Pulmonary:     Effort: Pulmonary effort is normal.     Breath sounds: Normal breath sounds.     Comments: Lungs clear to auscultation Musculoskeletal:        General: Normal range of motion.     Cervical back: Normal range of motion and neck supple.  Skin:    General: Skin is warm and dry.  Neurological:     Mental Status: He is alert and oriented to person, place, and time.  Psychiatric:  Mood and Affect: Mood normal.        Behavior: Behavior normal.        Thought Content: Thought content normal.        Judgment: Judgment normal.    Assessment and Plan: 1. Seasonal and perennial allergic rhinitis   2. Tree nut allergy      Patient Instructions  Allergic rhinitis Continue allergen avoidance measures directed toward pollen, pets, mold, dust mite, and cockroach as listed below Increase Dymista  to 2 sprays in each nostril up to twice a day if needed for nasal symptoms Continue an antihistamine once or twice a day for allergy  symptom control We will decrease your dose of allergen immunotherapy for the next couple of weeks  and then continue to move forward as tolerated.  Continue to have access to an epinephrine  autoinjector set per protocol  Food allergy  Continue to avoid hazelnuts.  In case of an allergic reaction, give cetirizine 10 mg every 24  hours, and if life-threatening symptoms occur, inject with EpiPen  0.3 mg.  Call the clinic if this treatment plan is not working well for you.  Follow up in 1 month or sooner if needed.  Return in about 4 weeks (around 11/30/2023).    Thank you for the opportunity to care for this patient.  Please do not hesitate to contact me with questions.  Marinus Sic, FNP [facility] 

## 2023-11-02 NOTE — Patient Instructions (Signed)
 Allergic rhinitis Continue allergen avoidance measures directed toward pollen, pets, mold, dust mite, and cockroach as listed below Increase Dymista  to 2 sprays in each nostril up to twice a day if needed for nasal symptoms Continue an antihistamine once or twice a day for allergy  symptom control We will decrease your dose of allergen immunotherapy for the next couple of weeks and then continue to move forward as tolerated.  Continue to have access to an epinephrine  autoinjector set per protocol  Food allergy  Continue to avoid hazelnuts.  In case of an allergic reaction, give cetirizine 10 mg every 24  hours, and if life-threatening symptoms occur, inject with EpiPen  0.3 mg.  Call the clinic if this treatment plan is not working well for you.  Follow up in 1 month or sooner if needed.  Reducing Pollen Exposure The American Academy of Allergy , Asthma and Immunology suggests the following steps to reduce your exposure to pollen during allergy  seasons. Do not hang sheets or clothing out to dry; pollen may collect on these items. Do not mow lawns or spend time around freshly cut grass; mowing stirs up pollen. Keep windows closed at night.  Keep car windows closed while driving. Minimize morning activities outdoors, a time when pollen counts are usually at their highest. Stay indoors as much as possible when pollen counts or humidity is high and on windy days when pollen tends to remain in the air longer. Use air conditioning when possible.  Many air conditioners have filters that trap the pollen spores. Use a HEPA room air filter to remove pollen form the indoor air you breathe.  Control of Mold Allergen Mold and fungi can grow on a variety of surfaces provided certain temperature and moisture conditions exist.  Outdoor molds grow on plants, decaying vegetation and soil.  The major outdoor mold, Alternaria and Cladosporium, are found in very high numbers during hot and dry conditions.  Generally,  a late Summer - Fall peak is seen for common outdoor fungal spores.  Rain will temporarily lower outdoor mold spore count, but counts rise rapidly when the rainy period ends.  The most important indoor molds are Aspergillus and Penicillium.  Dark, humid and poorly ventilated basements are ideal sites for mold growth.  The next most common sites of mold growth are the bathroom and the kitchen.  Outdoor [employer] Use air conditioning and keep windows closed Avoid exposure to decaying vegetation. Avoid leaf raking. Avoid grain handling. Consider wearing a face mask if working in moldy areas.  Indoor Mold Control Maintain humidity below 50%. Clean washable surfaces with 5% bleach solution. Remove sources e.g. Contaminated carpets.  Control of Dog or Cat Allergen Avoidance is the best way to manage a dog or cat allergy . If you have a dog or cat and are allergic to dog or cats, consider removing the dog or cat from the home. If you have a dog or cat but don't want to find it a new home, or if your family wants a pet even though someone in the household is allergic, here are some strategies that may help keep symptoms at bay:  Keep the pet out of your bedroom and restrict it to only a few rooms. Be advised that keeping the dog or cat in only one room will not limit the allergens to that room. Don't pet, hug or kiss the dog or cat; if you do, wash your hands with soap and water. High-efficiency particulate air (HEPA) cleaners run continuously in a bedroom  or living room can reduce allergen levels over time. Regular use of a high-efficiency vacuum cleaner or a central vacuum can reduce allergen levels. Giving your dog or cat a bath at least once a week can reduce airborne allergen.  We have discussed where to find each, and how to avoid them. It will take several weeks to months for your rash to improve, but please avoid these for at least 2 months.  You will need to avoid these for at least 8  weeks to see improvement to determine if these are the cause of your rash.   There are two ways to access the safe list of products that excludes your allergens.  Option 1: Go to www.acdscamp.org Click on 'Patient' Click on 'Accept' under user agreement Enter search code 1:UVFav Click 'Free Trial' The website generates a PDF.  Option 2: We now also have a free app for your safe list. Search "ACDS CAMP" on your mobile device and download the app. Insert your codes and your safe list of products can be viewed on your phone.   Repeat Open Application Test (ROAT) Procedure  Your doctor may suggest you perform a special test to see if you are sensitive to a product. This test can help you determine if you can safely use a particular product or not. Never do this test with a substance that is meant to be rinsed off the skin or hair such as a soap or shampoo.  1. Do not perform a ROAT for at least 3 weeks after your patch tests are done.  2. Apply the test product twice a day to a quarter-sized area of the thin skin on the inside of your arm just above the elbow bend.  3. Do this every day for one week. Examine the area each day for redness, small bumps, blisters, or peeling. If pinkness, bubbles or small bumps appear, stop the test - you now know that you can't use this product. If the site begins to feel itchy or irritated, stop.  4. If there is no reaction by the end of the first week, continue applying the product twice a day for one more week. Again note any local skin changes that appear.   5. Stop after two weeks of applying the substance to the spot twice a day. If no reaction has occurred, you are very unlikely to be allergic to the product you have tested.   Control of Cockroach Allergen Cockroach allergen has been identified as an important cause of acute attacks of asthma, especially in urban settings.  There are fifty-five species of cockroach that exist in the [place] ,  however only three, the [place], [place] and [place] species produce allergen that can affect patients with Asthma.  Allergens can be obtained from fecal particles, egg casings and secretions from cockroaches.    Remove food sources. Reduce access to water. Seal access and entry points. Spray runways with 0.5-1% Diazinon or Chlorpyrifos Blow boric acid power under stoves and refrigerator. Place bait stations (hydramethylnon) at feeding sites.

## 2023-11-07 ENCOUNTER — Ambulatory Visit (INDEPENDENT_AMBULATORY_CARE_PROVIDER_SITE_OTHER)

## 2023-11-07 DIAGNOSIS — J309 Allergic rhinitis, unspecified: Secondary | ICD-10-CM | POA: Diagnosis not present

## 2023-11-14 ENCOUNTER — Ambulatory Visit (INDEPENDENT_AMBULATORY_CARE_PROVIDER_SITE_OTHER): Payer: Self-pay

## 2023-11-14 DIAGNOSIS — J309 Allergic rhinitis, unspecified: Secondary | ICD-10-CM | POA: Diagnosis not present

## 2023-11-21 ENCOUNTER — Ambulatory Visit (INDEPENDENT_AMBULATORY_CARE_PROVIDER_SITE_OTHER)

## 2023-11-21 DIAGNOSIS — J309 Allergic rhinitis, unspecified: Secondary | ICD-10-CM

## 2023-11-28 ENCOUNTER — Ambulatory Visit (INDEPENDENT_AMBULATORY_CARE_PROVIDER_SITE_OTHER)

## 2023-11-28 DIAGNOSIS — J309 Allergic rhinitis, unspecified: Secondary | ICD-10-CM | POA: Diagnosis not present

## 2023-12-05 ENCOUNTER — Ambulatory Visit (INDEPENDENT_AMBULATORY_CARE_PROVIDER_SITE_OTHER)

## 2023-12-05 DIAGNOSIS — J309 Allergic rhinitis, unspecified: Secondary | ICD-10-CM | POA: Diagnosis not present

## 2023-12-12 ENCOUNTER — Ambulatory Visit (INDEPENDENT_AMBULATORY_CARE_PROVIDER_SITE_OTHER): Payer: Self-pay

## 2023-12-12 DIAGNOSIS — J309 Allergic rhinitis, unspecified: Secondary | ICD-10-CM

## 2024-01-02 ENCOUNTER — Ambulatory Visit (INDEPENDENT_AMBULATORY_CARE_PROVIDER_SITE_OTHER)

## 2024-01-02 DIAGNOSIS — J309 Allergic rhinitis, unspecified: Secondary | ICD-10-CM | POA: Diagnosis not present

## 2024-01-09 ENCOUNTER — Ambulatory Visit (INDEPENDENT_AMBULATORY_CARE_PROVIDER_SITE_OTHER)

## 2024-01-09 DIAGNOSIS — J309 Allergic rhinitis, unspecified: Secondary | ICD-10-CM | POA: Diagnosis not present

## 2024-01-23 ENCOUNTER — Ambulatory Visit (INDEPENDENT_AMBULATORY_CARE_PROVIDER_SITE_OTHER)

## 2024-01-23 DIAGNOSIS — J309 Allergic rhinitis, unspecified: Secondary | ICD-10-CM | POA: Diagnosis not present

## 2024-01-30 ENCOUNTER — Ambulatory Visit (INDEPENDENT_AMBULATORY_CARE_PROVIDER_SITE_OTHER)

## 2024-01-30 DIAGNOSIS — J309 Allergic rhinitis, unspecified: Secondary | ICD-10-CM | POA: Diagnosis not present

## 2024-02-01 ENCOUNTER — Ambulatory Visit (INDEPENDENT_AMBULATORY_CARE_PROVIDER_SITE_OTHER): Admitting: Allergy & Immunology

## 2024-02-01 ENCOUNTER — Encounter: Payer: Self-pay | Admitting: Allergy & Immunology

## 2024-02-01 ENCOUNTER — Other Ambulatory Visit: Payer: Self-pay

## 2024-02-01 VITALS — BP 128/88 | HR 104 | Temp 99.0°F | Resp 18 | Ht 72.44 in | Wt 171.0 lb

## 2024-02-01 DIAGNOSIS — T7805XD Anaphylactic reaction due to tree nuts and seeds, subsequent encounter: Secondary | ICD-10-CM

## 2024-02-01 DIAGNOSIS — R1114 Bilious vomiting: Secondary | ICD-10-CM | POA: Diagnosis not present

## 2024-02-01 DIAGNOSIS — J302 Other seasonal allergic rhinitis: Secondary | ICD-10-CM

## 2024-02-01 DIAGNOSIS — J3089 Other allergic rhinitis: Secondary | ICD-10-CM

## 2024-02-01 DIAGNOSIS — Z91018 Allergy to other foods: Secondary | ICD-10-CM

## 2024-02-01 MED ORDER — AZELASTINE-FLUTICASONE 137-50 MCG/ACT NA SUSP: 2.0000 | Freq: Every day | NASAL | 11 refills | Status: AC | PRN

## 2024-02-01 MED ORDER — CYPROHEPTADINE HCL 4 MG PO TABS: 8.0000 mg | ORAL_TABLET | Freq: Every day | ORAL | 11 refills | Status: AC

## 2024-02-01 MED ORDER — LEVOCETIRIZINE DIHYDROCHLORIDE 5 MG PO TABS: 5.0000 mg | ORAL_TABLET | Freq: Every evening | ORAL | 3 refills | Status: AC

## 2024-02-01 MED ORDER — DOXYCYCLINE MONOHYDRATE 100 MG PO TABS: 100.0000 mg | ORAL_TABLET | Freq: Two times a day (BID) | ORAL | 0 refills | Status: AC

## 2024-02-01 NOTE — Progress Notes (Signed)
 FOLLOW UP  Date of Service/Encounter:  02/01/24   Assessment:   Tree nut allergy  (walnuts and hazelnuts)    Emesis - sounds like this is from postnasal drip   Perennial and seasonal allergic rhinitis (grasses, ragweed, weeds, trees, cat, and cockroach) - we are going to work on controlling the postnasal drip with medications before advancing to allergy  shots   Developmentally delayed - established into a number of services  Plan/Recommendations:   1. Chronic rhinitis  - Testing at the past showed: grasses, ragweed, weeds, trees, cat, and cockroach - Continue taking: Dymista  two sprays per nostril twice daily (aim for the ears)  - Continue taking: Xyzal  (levocetirizine) 5mg  tablet once daily - Continue taking: Periactin  (cyproheptadine ) 8mg  at night - You can use an extra dose of the antihistamine, if needed, for breakthrough symptoms.  - Consider nasal saline rinses 1-2 times daily to remove allergens from the nasal cavities as well as help with mucous clearance (this is especially helpful to do before the nasal sprays are given) - Continue with allergy  shots at the same schedule.  - We will start you on doxycycline  100mg  twice daily for 7 days. - Be sure to use sun protection while on this drug.  2. Vomiting - Hopefully controlling postnasal drip and mucus will help with decreasing vomiting.  3. 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  - I think it is fine to eat the other tree nuts.   4. Return in about 1 year (around 01/31/2025). You can have the follow up appointment with Dr. Iva or a Nurse Practicioner (our Nurse Practitioners are excellent and always have Physician oversight!).   Subjective:   Jared Griffith is a 23 y.o. male presenting today for follow up of  Chief Complaint  Patient presents with   Allergic Rhinitis     Patient dad states that when he is upped on the injection volume gets like cold symptoms.    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  Discussed the use of AI scribe software for clinical note transcription with the patient and/or guardian, who gave verbal consent to proceed.  Jared Griffith is a 23 y.o. male presenting for a follow up visit.  He was last seen in April 2025.  At that time, we continue with Benlysta as well as Xyzal .  We did start Periactin  because he needs more anticholinergic treatment and we felt it might help with his nausea as well.  He continues on allergy  shots same schedule.  For the vomiting, we hoped that the Periactin  would help.  He continues to avoid hazelnut.  Since last visit, he has mostly done well.  Allergic Rhinitis Symptom History: He experiences cold symptoms, including a runny nose, following his allergy  shots. These symptoms typically occur immediately after the shots and last for two to three days. The symptoms  are more pronounced when the dose of the shots is increased. He received his most recent shot on Wednesday, and symptoms began that night. No itching or reactions at the injection site. No one else at home is sick.  Jared Griffith is on allergen immunotherapy. He receives two injections. Immunotherapy script #1 contains trees, weeds, grasses, cat, and dog. He currently receives 0.80mL of the GREEN vial (1/1,000).  Immunotherapy script #2 contains ragweed, molds, dust mites, and cockroach. He currently receives 0.33mL of the GREEN vial (1/1,000). He started shots March of 2025 and not yet reached maintenance.   He has a history of allergies to sulfa and penicillin, with the penicillin allergy  identified during childhood. He is currently taking cyproheptadine , which has helped reduce vomiting episodes that used to occur during showers. He has not vomited in a long time, although he still experiences stuffiness and coughing during showers.  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 He had a recent surgical procedure to cut a flap under his tongue.   Otherwise, there have been no changes to his past medical history, surgical history, family history, or social history.    Review of systems otherwise negative other than that mentioned in the HPI.    Objective:   Blood pressure 128/88, pulse (!) 104, temperature 99 F (37.2 C), temperature source Temporal, resp. rate 18, height 6' 0.44 (1.84 m), weight 171 lb (77.6 kg), SpO2 95%. Body mass index is 22.91 kg/m.    Physical Exam Vitals reviewed.  Constitutional:      Appearance: He is well-developed and underweight. He is not ill-appearing or toxic-appearing.     Comments: Developmentally delayed. Talkative.   HENT:     Head: Normocephalic and atraumatic.     Right Ear: Tympanic membrane, ear canal and external ear normal. No drainage, swelling or tenderness. Tympanic membrane is not injected, scarred, erythematous, retracted or bulging.     Left Ear: Tympanic membrane, ear canal and external ear normal. No drainage, swelling or tenderness. Tympanic membrane is not injected, scarred, erythematous, retracted or bulging.     Nose: Mucosal edema and rhinorrhea present. No nasal deformity or septal deviation.     Right Turbinates: Enlarged, swollen  and pale.     Left Turbinates: Enlarged, swollen and pale.     Right Sinus: No maxillary sinus tenderness or frontal sinus tenderness.     Left Sinus: No maxillary sinus tenderness or frontal sinus tenderness.     Comments: No polyps noted.     Mouth/Throat:     Mouth: Mucous membranes are not pale and not dry.     Pharynx: Uvula midline.  Eyes:     General: Lids are normal. Allergic shiner present.        Right eye: No discharge.        Left eye: No discharge.     Conjunctiva/sclera: Conjunctivae normal.     Right eye: Right conjunctiva is not injected. No chemosis.    Left eye: Left conjunctiva is not injected. No chemosis.    Pupils: Pupils are equal, round, and reactive to light.  Cardiovascular:     Rate and Rhythm: Normal rate and regular rhythm.     Heart sounds: Normal heart sounds.  Pulmonary:     Effort: Pulmonary effort is normal. No tachypnea, accessory muscle usage or respiratory distress.     Breath sounds: Normal breath  sounds. No wheezing, rhonchi or rales.  Chest:     Chest wall: No tenderness.  Abdominal:     Tenderness: There is no abdominal tenderness. There is no guarding or rebound.  Lymphadenopathy:     Head:     Right side of head: No submandibular, tonsillar or occipital adenopathy.     Left side of head: No submandibular, tonsillar or occipital adenopathy.     Cervical: No cervical adenopathy.  Skin:    General: Skin is warm.     Capillary Refill: Capillary refill takes less than 2 seconds.     Coloration: Skin is not pale.     Findings: No abrasion, erythema, petechiae or rash. Rash is not papular, urticarial or vesicular.  Neurological:     Mental Status: He is alert.  Psychiatric:        Behavior: Behavior is cooperative.      Diagnostic studies: none      Marty Shaggy, MD  [facility] 

## 2024-02-01 NOTE — Patient Instructions (Addendum)
 1. Chronic rhinitis  - Testing at the past showed: grasses, ragweed, weeds, trees, cat, and cockroach - Continue taking: Dymista  two sprays per nostril twice daily (aim for the ears)  - Continue taking: Xyzal  (levocetirizine) 5mg  tablet once daily - Continue taking: Periactin  (cyproheptadine ) 8mg  at night - You can use an extra dose of the antihistamine, if needed, for breakthrough symptoms.  - Consider nasal saline rinses 1-2 times daily to remove allergens from the nasal cavities as well as help with mucous clearance (this is especially helpful to do before the nasal sprays are given) - Continue with allergy  shots at the same schedule.  - We will start you on doxycycline  100mg  twice daily for 7 days. - Be sure to use sun protection while on this drug.  2. Vomiting - Hopefully controlling postnasal drip and mucus will help with decreasing vomiting.  3. 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  - I think it is fine to eat the other tree nuts.   4. Return in about 1 year (around 01/31/2025). You can have the follow up appointment with Dr. Iva or a Nurse Practicioner (our Nurse Practitioners are excellent and always have Physician oversight!).    Please inform us  of any Emergency Department visits, hospitalizations, or changes in symptoms. Call us  before going to the ED for breathing or allergy  symptoms since we might be able to fit you in for a sick visit. Feel free to contact us  anytime with any questions, problems, or concerns.  It was a pleasure to see you and your family again today!  Websites that have reliable patient information: 1. American Academy of Asthma, Allergy , and Immunology: www.aaaai.org 2. Food Allergy  Research and Education (FARE): foodallergy.org 3. Mothers of Asthmatics: http://www.asthmacommunitynetwork.org 4. [employer] of Allergy , Asthma, and Immunology: www.acaai.org      "Like" us  on Facebook and Instagram for our  latest updates!      A healthy democracy works best when [employer] participate! Make sure you are registered to vote! If you have moved or changed any of your contact information, you will need to get this updated before voting! Scan the QR codes below to learn more!

## 2024-02-05 ENCOUNTER — Encounter: Payer: Self-pay | Admitting: Allergy & Immunology

## 2024-02-06 ENCOUNTER — Ambulatory Visit (INDEPENDENT_AMBULATORY_CARE_PROVIDER_SITE_OTHER)

## 2024-02-06 DIAGNOSIS — J309 Allergic rhinitis, unspecified: Secondary | ICD-10-CM

## 2024-02-13 ENCOUNTER — Ambulatory Visit (INDEPENDENT_AMBULATORY_CARE_PROVIDER_SITE_OTHER)

## 2024-02-13 DIAGNOSIS — J309 Allergic rhinitis, unspecified: Secondary | ICD-10-CM

## 2024-02-19 ENCOUNTER — Encounter: Payer: Self-pay | Admitting: Allergy & Immunology

## 2024-02-20 ENCOUNTER — Other Ambulatory Visit (HOSPITAL_COMMUNITY): Payer: Self-pay | Admitting: Nurse Practitioner

## 2024-02-20 ENCOUNTER — Other Ambulatory Visit: Payer: Self-pay | Admitting: Nurse Practitioner

## 2024-02-20 ENCOUNTER — Encounter: Payer: Self-pay | Admitting: Allergy & Immunology

## 2024-02-20 ENCOUNTER — Ambulatory Visit
Admission: RE | Admit: 2024-02-20 | Discharge: 2024-02-20 | Disposition: A | Discharge status: Home / Self Care | Source: Ambulatory Visit | Attending: Nurse Practitioner | Admitting: Nurse Practitioner

## 2024-02-20 ENCOUNTER — Ambulatory Visit (HOSPITAL_COMMUNITY): Admission: RE | Admit: 2024-02-20 | Source: Ambulatory Visit

## 2024-02-20 DIAGNOSIS — R1031 Right lower quadrant pain: Secondary | ICD-10-CM

## 2024-02-20 MED ORDER — IOPAMIDOL (ISOVUE-300) INJECTION 61%
100.0000 mL | Freq: Once | INTRAVENOUS | Status: AC | PRN
  Administered 2024-02-20: 100 mL via INTRAVENOUS

## 2024-05-07 ENCOUNTER — Telehealth: Payer: Self-pay | Admitting: Allergy & Immunology

## 2024-05-07 NOTE — Telephone Encounter (Signed)
 I called regarding his allergy  shots.Mom reported that he was getting sick with shots in the fall. But she wants to restart them in late February. Current vials expire in mid February so we are going to throw out the current vials.  Mom will call back later this week to schedule a start injection appointment.   Marty Shaggy, MD [facility]
